# Patient Record
Sex: Female | Born: 2000 | Hispanic: Yes | Marital: Single | Smoking: Never smoker
Health system: Southern US, Community
[De-identification: ages and names within clinical notes are randomized; demographics above are authoritative.]

## PROBLEM LIST (undated history)

## (undated) DIAGNOSIS — K760 Fatty (change of) liver, not elsewhere classified: Secondary | ICD-10-CM

## (undated) DIAGNOSIS — J4520 Mild intermittent asthma, uncomplicated: Secondary | ICD-10-CM

## (undated) HISTORY — DX: Mild intermittent asthma, uncomplicated: J45.20

## (undated) HISTORY — DX: Fatty (change of) liver, not elsewhere classified: K76.0

---
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
## 2022-09-12 ENCOUNTER — Ambulatory Visit: Payer: Self-pay | Admitting: Emergency Medicine

## 2023-07-27 ENCOUNTER — Other Ambulatory Visit: Payer: Self-pay

## 2023-07-27 ENCOUNTER — Ambulatory Visit
Admission: RE | Admit: 2023-07-27 | Discharge: 2023-07-27 | Disposition: A | Discharge status: Home / Self Care | Payer: Self-pay | Source: Ambulatory Visit | Attending: Physician Assistant | Admitting: Physician Assistant

## 2023-07-27 ENCOUNTER — Ambulatory Visit: Payer: Self-pay

## 2023-07-27 VITALS — BP 115/77 | HR 109 | Temp 98.2°F | Resp 20 | Ht 66.0 in | Wt 245.0 lb

## 2023-07-27 DIAGNOSIS — R102 Pelvic and perineal pain: Secondary | ICD-10-CM | POA: Diagnosis not present

## 2023-07-27 DIAGNOSIS — R21 Rash and other nonspecific skin eruption: Secondary | ICD-10-CM | POA: Diagnosis not present

## 2023-07-27 DIAGNOSIS — R300 Dysuria: Secondary | ICD-10-CM | POA: Diagnosis not present

## 2023-07-27 LAB — POCT URINALYSIS DIP (MANUAL ENTRY)
Glucose, UA: NEGATIVE mg/dL
Nitrite, UA: NEGATIVE
Protein Ur, POC: >=300 mg/dL — AB
Spec Grav, UA: >=1.03 — AB (ref 1.010–1.025)
Urobilinogen, UA: 0.2 U/dL
pH, UA: 6 (ref 5.0–8.0)

## 2023-07-27 LAB — POCT URINE PREGNANCY: Preg Test, Ur: NEGATIVE

## 2023-07-27 MED ORDER — FLUCONAZOLE 150 MG PO TABS: 150.0000 mg | ORAL_TABLET | ORAL | 0 refills | Status: AC | PRN

## 2023-07-27 MED ORDER — NITROFURANTOIN MONOHYD MACRO 100 MG PO CAPS: 100.0000 mg | ORAL_CAPSULE | Freq: Two times a day (BID) | ORAL | 0 refills | Status: AC

## 2023-07-27 NOTE — ED Provider Notes (Signed)
 [facility]    CSN: [id number] Arrival date & time: 07/27/23  0903      History   Chief Complaint Chief Complaint  Patient presents with   Vaginal Discomfort     HPI Alison Lara is a 23 y.o. female.   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 No pertinent surgical history.  OB History   No obstetric history on file.      Home Medications    Prior to Admission medications   Medication Sig Start Date End Date Taking? Authorizing Provider  fluconazole (DIFLUCAN) 150 MG tablet Take 1 tablet (150 mg total) by mouth every three (3) days as needed. May repeat in 3 days if symptoms not resolved 07/27/23  Yes Rhyann Berton E, PA-C  nitrofurantoin, macrocrystal-monohydrate, (MACROBID) 100 MG capsule Take 1 capsule (100 mg total) by mouth 2 (two) times daily for 5 days. 07/27/23 08/01/23 Yes Arleatha Philipps E, PA-C  albuterol (PROVENTIL HFA;VENTOLIN HFA) 108 (90 Base) MCG/ACT inhaler Inhale 2 puffs into the lungs every 6 (six) hours as needed for wheezing or shortness of breath.    [provider]  Omega 3 1000 MG CAPS Take 1,000 mg by mouth daily.     [provider]  Pediatric  Multivit-Minerals-C (MULTIVITAMINS PEDIATRIC PO) Take 1 tablet by mouth daily.     [provider]    Family History Family History  Problem Relation Age of Onset   Diabetes Other    Hypertension Paternal Grandmother    Diabetes Paternal Grandfather    Heart disease Neg Hx     Social History Social History   Tobacco Use   Smoking status: Never   Smokeless tobacco: Never  Vaping Use   Vaping status: Every Day  Substance Use Topics   Alcohol use: No   Drug use: No     Allergies   Patient has no known allergies.   Review of Systems Review of Systems  Genitourinary:  Positive for dysuria and vaginal pain. Negative for hematuria, vaginal bleeding and vaginal discharge.  Skin:  Positive for rash.     Physical Exam Triage Vital Signs ED Triage Vitals  Encounter Vitals Group     BP 07/27/23 0950 115/77     Systolic BP Percentile --      Diastolic BP Percentile --      Pulse Rate 07/27/23 0950 (!) 109     Resp 07/27/23 0950 20     Temp 07/27/23 0950 98.2 F (36.8 C)     Temp Source 07/27/23 0950 Oral     SpO2 07/27/23 0950 97 %     Weight 07/27/23 0951 245 lb (111.1 kg)  Height 07/27/23 0951 5\' 6"  (1.676 m)     Head Circumference --      Peak Flow --      Pain Score 07/27/23 0951 3     Pain Loc --      Pain Education --      Exclude from Growth Chart --    No data found.  Updated Vital Signs BP 115/77 (BP Location: Right Arm)   Pulse (!) 109   Temp 98.2 F (36.8 C) (Oral)   Resp 20   Ht 5\' 6"  (1.676 m)   Wt 245 lb (111.1 kg)   LMP 07/13/2023 (Exact Date)   SpO2 97%   BMI 39.54 kg/m   Visual Acuity Right Eye Distance:   Left Eye Distance:   Bilateral Distance:    Right Eye Near:   Left Eye Near:    Bilateral Near:     Physical Exam Vitals reviewed. Exam conducted with a chaperone present.  Constitutional:      General: She is awake.     Appearance: Normal appearance. She is well-developed and well-groomed.  HENT:     Head:  Normocephalic and atraumatic.  Eyes:     General: Lids are normal. Gaze aligned appropriately.     Extraocular Movements: Extraocular movements intact.     Conjunctiva/sclera: Conjunctivae normal.  Pulmonary:     Effort: Pulmonary effort is normal.  Genitourinary:    Pubic Area: Rash present.     Tanner stage (genital): 5.     Labia:        Right: Rash and tenderness present.        Left: Rash and tenderness present.      Vagina: Vaginal discharge and tenderness present.     Comments: Patient has redness and irritation along labia minora and labia majora.  Vaginal opening also appears erythematous and there is scant amount of watery discharge Neurological:     General: No focal deficit present.     Mental Status: She is alert and oriented to person, place, and time.     GCS: GCS eye subscore is 4. GCS verbal subscore is 5. GCS motor subscore is 6.     Cranial Nerves: No cranial nerve deficit, dysarthria or facial asymmetry.  Psychiatric:        Attention and Perception: Attention and perception normal.        Mood and Affect: Mood and affect normal.        Speech: Speech normal.        Behavior: Behavior normal. Behavior is cooperative.      UC Treatments / Results  Labs (all labs ordered are listed, but only abnormal results are displayed) Labs Reviewed  POCT URINALYSIS DIP (MANUAL ENTRY) - Abnormal; Notable for the following components:      Result Value   Color, UA straw (*)    Clarity, UA turbid (*)    Bilirubin, UA small (*)    Ketones, POC UA small (15) (*)    Spec Grav, UA >=1.030 (*)    Blood, UA small (*)    Protein Ur, POC >=300 (*)    Leukocytes, UA Small (1+) (*)    All other components within normal limits  URINE CULTURE  POCT URINE PREGNANCY  CERVICOVAGINAL ANCILLARY ONLY    EKG   Radiology No results found.  Procedures Procedures (including critical care time)  Medications Ordered in UC Medications - No data to display  Initial Impression /  Assessment and Plan /  UC Course  I have reviewed the triage vital signs and the nursing notes.  Pertinent labs & imaging results that were available during my care of the patient were reviewed by me and considered in my medical decision making (see chart for details).      Final Clinical Impressions(s) / UC Diagnoses   Final diagnoses:  Dysuria  Vulvovaginal rash  Vulvovaginal discomfort   Patient has today with concerns for vulvovaginal discomfort and dysuria.  She reports this has been ongoing since Monday and has progressively worsened throughout the week.  Urine dip was notable for leukocytes, protein, blood, ketones, bilirubin.  At this time I am suspicious for potential UTI so we will send in Macrobid p.o. twice daily x 5 days and send sample for culture for ID and susceptibility testing.  Physical exam is also notable for erythema and irritation of the labia majora and labia minora as well as the vaginal introitus.  At this time suspect potential yeast infection so we will go ahead and send in Diflucan.  Cervicovaginal swab collected for definitive evaluation.  Results to dictate further management.  ED return precautions reviewed and provided in after visit summary.  Follow-up as needed.    Discharge Instructions      You were seen today for concerns for vulvovaginal discomfort and pain with urination.  Your urine sample demonstrated signs consistent with likely UTI.  I have sent in an antibiotic called Macrobid for you to take twice per day for 5 days to help treat this.  We have sent a sample for urine culture to identify the bacteria and make sure that the antibiotic will be sufficient to treat this.  We will keep you updated on those results once they are available. You were also seen for concerns of vaginal discomfort and redness.  At this time your symptoms appear consistent with a vulvovaginal infection such as a yeast infection.  I have sent in a medication called Diflucan.   Use as directed to assist with this.  We have also collected a cervicovaginal swab to assess for bacterial vaginosis, trichomonas, yeast, gonorrhea, chlamydia.  We will keep you updated with those results once they are available. In the meantime I recommend trying to find a gentle cleanser such as Cetaphil or a pH balanced vulvovaginal wash to help gently cleanse the area.  You can use warm water and 1 of these gentle soaps to help cleanse the area while you are waiting for the results and for the irritation to resolve. If at any point you start to have fever, chills, difficulty urinating, more severe rash or pain, redness please go to the emergency room as these could be signs of a medical emergency.   ED Prescriptions     Medication Sig Dispense Auth. Provider   nitrofurantoin, macrocrystal-monohydrate, (MACROBID) 100 MG capsule Take 1 capsule (100 mg total) by mouth 2 (two) times daily for 5 days. 10 capsule Naydene Kamrowski E, PA-C   fluconazole (DIFLUCAN) 150 MG tablet Take 1 tablet (150 mg total) by mouth every three (3) days as needed. May repeat in 3 days if symptoms not resolved 2 tablet Verbie Babic E, PA-C      PDMP not reviewed this encounter.   Suzzanne Brunkhorst, Pearla Bottom, PA-C 07/27/23 1210

## 2023-07-27 NOTE — Discharge Instructions (Addendum)
 You were seen today for concerns for vulvovaginal discomfort and pain with urination.  Your urine sample demonstrated signs consistent with likely UTI.  I have sent in an antibiotic called Macrobid for you to take twice per day for 5 days to help treat this.  We have sent a sample for urine culture to identify the bacteria and make sure that the antibiotic will be sufficient to treat this.  We will keep you updated on those results once they are available. You were also seen for concerns of vaginal discomfort and redness.  At this time your symptoms appear consistent with a vulvovaginal infection such as a yeast infection.  I have sent in a medication called Diflucan.  Use as directed to assist with this.  We have also collected a cervicovaginal swab to assess for bacterial vaginosis, trichomonas, yeast, gonorrhea, chlamydia.  We will keep you updated with those results once they are available. In the meantime I recommend trying to find a gentle cleanser such as Cetaphil or a pH balanced vulvovaginal wash to help gently cleanse the area.  You can use warm water and 1 of these gentle soaps to help cleanse the area while you are waiting for the results and for the irritation to resolve. If at any point you start to have fever, chills, difficulty urinating, more severe rash or pain, redness please go to the emergency room as these could be signs of a medical emergency.

## 2023-07-27 NOTE — ED Triage Notes (Signed)
 Pt presents with complaints of vaginal discomfort x 5-6 days. Pt states it burns to urinate. Pt currently rates her overall vaginal pain a 3/10 at rest, pain increases with urination and ambulation. Denies taking or applying medications PTA. Pt states the vaginal area is very red and irritated.

## 2023-07-28 ENCOUNTER — Encounter (HOSPITAL_BASED_OUTPATIENT_CLINIC_OR_DEPARTMENT_OTHER): Payer: Self-pay | Admitting: Emergency Medicine

## 2023-07-28 ENCOUNTER — Emergency Department (HOSPITAL_BASED_OUTPATIENT_CLINIC_OR_DEPARTMENT_OTHER)
Admission: EM | Admit: 2023-07-28 | Discharge: 2023-07-29 | Disposition: A | Discharge status: Home / Self Care | Payer: Self-pay | Attending: Emergency Medicine | Admitting: Emergency Medicine

## 2023-07-28 DIAGNOSIS — N939 Abnormal uterine and vaginal bleeding, unspecified: Secondary | ICD-10-CM | POA: Diagnosis not present

## 2023-07-28 DIAGNOSIS — N898 Other specified noninflammatory disorders of vagina: Secondary | ICD-10-CM | POA: Diagnosis present

## 2023-07-28 DIAGNOSIS — A6000 Herpesviral infection of urogenital system, unspecified: Secondary | ICD-10-CM | POA: Diagnosis not present

## 2023-07-28 DIAGNOSIS — A6004 Herpesviral vulvovaginitis: Secondary | ICD-10-CM | POA: Insufficient documentation

## 2023-07-28 DIAGNOSIS — B009 Herpesviral infection, unspecified: Secondary | ICD-10-CM | POA: Diagnosis not present

## 2023-07-28 LAB — URINALYSIS, ROUTINE W REFLEX MICROSCOPIC
Bacteria, UA: NONE SEEN
Bilirubin Urine: NEGATIVE
Glucose, UA: NEGATIVE mg/dL
Ketones, ur: 40 mg/dL — AB
Nitrite: NEGATIVE
Protein, ur: 100 mg/dL — AB
RBC / HPF: >50 RBC/hpf (ref 0–5)
Specific Gravity, Urine: 1.034 — ABNORMAL HIGH (ref 1.005–1.030)
WBC, UA: >50 WBC/hpf (ref 0–5)
pH: 6 (ref 5.0–8.0)

## 2023-07-28 LAB — CERVICOVAGINAL ANCILLARY ONLY
Bacterial Vaginitis (gardnerella): NEGATIVE
Candida Glabrata: NEGATIVE
Candida Vaginitis: NEGATIVE
Chlamydia: NEGATIVE
Comment: NEGATIVE
Comment: NEGATIVE
Comment: NEGATIVE
Comment: NEGATIVE
Comment: NEGATIVE
Comment: NORMAL
Neisseria Gonorrhea: NEGATIVE
Trichomonas: NEGATIVE

## 2023-07-28 LAB — WET PREP, GENITAL
Clue Cells Wet Prep HPF POC: NONE SEEN
Sperm: NONE SEEN
Trich, Wet Prep: NONE SEEN
WBC, Wet Prep HPF POC: >=10 — AB (ref <10)
Yeast Wet Prep HPF POC: NONE SEEN

## 2023-07-28 MED ORDER — FENTANYL CITRATE PF 50 MCG/ML IJ SOSY
50.0000 ug | PREFILLED_SYRINGE | Freq: Once | INTRAMUSCULAR | Status: AC
  Administered 2023-07-28: 50 ug via INTRAMUSCULAR

## 2023-07-28 MED ORDER — IBUPROFEN 800 MG PO TABS
800.0000 mg | ORAL_TABLET | Freq: Once | ORAL | Status: AC
  Administered 2023-07-28: 800 mg via ORAL
  Filled 2023-07-28: qty 1

## 2023-07-28 MED ORDER — FENTANYL CITRATE PF 50 MCG/ML IJ SOSY
50.0000 ug | PREFILLED_SYRINGE | Freq: Once | INTRAMUSCULAR | Status: DC
  Filled 2023-07-28: qty 1

## 2023-07-28 MED ORDER — VALACYCLOVIR HCL 500 MG PO TABS
1000.0000 mg | ORAL_TABLET | Freq: Once | ORAL | Status: AC
  Administered 2023-07-28: 1000 mg via ORAL
  Filled 2023-07-28: qty 2

## 2023-07-28 NOTE — ED Provider Notes (Signed)
 [facility] AT [facility] Provider Note   CSN: [id number] Arrival date & time: 07/28/23  2018     History {Add pertinent medical, surgical, social history, OB history to HPI:1} Chief Complaint  Patient presents with  . vaginal discomfort    Alison Lara is a 23 y.o. female.  HPI     Home Medications Prior to Admission medications   Medication Sig Start Date End Date Taking? Authorizing Provider  albuterol (PROVENTIL HFA;VENTOLIN HFA) 108 (90 Base) MCG/ACT inhaler Inhale 2 puffs into the lungs every 6 (six) hours as needed for wheezing or shortness of breath.    [provider]  fluconazole  (DIFLUCAN ) 150 MG tablet Take 1 tablet (150 mg total) by mouth every three (3) days as needed. May repeat in 3 days if symptoms not resolved 07/27/23   Mecum, Erin E, PA-C  nitrofurantoin , macrocrystal-monohydrate, (MACROBID ) 100 MG capsule Take 1 capsule (100 mg total) by mouth 2 (two) times daily for 5 days. 07/27/23 08/01/23  Mecum, Erin E, PA-C  Omega 3 1000 MG CAPS Take 1,000 mg by mouth daily.     [provider]  Pediatric Multivit-Minerals-C (MULTIVITAMINS PEDIATRIC PO) Take 1 tablet by mouth daily.     [provider]      Allergies    Patient has no known allergies.    Review of Systems   Review of Systems  Physical Exam Updated Vital Signs BP (!) 148/103 (BP Location: Right Arm)   Pulse (!) 114   Temp 98.4 F (36.9 C)   Resp 20   LMP 07/13/2023 (Exact Date)   SpO2 99%  Physical Exam  ED Results / Procedures / Treatments   Labs (all labs ordered are listed, but only abnormal results are displayed) Labs Reviewed  WET PREP, GENITAL - Abnormal; Notable for the following components:      Result Value   WBC, Wet Prep HPF POC >=10 (*)    All other components within normal limits  URINALYSIS, ROUTINE W REFLEX MICROSCOPIC - Abnormal; Notable for the following components:   APPearance HAZY (*)    Specific  Gravity, Urine 1.034 (*)    Hgb urine dipstick SMALL (*)    Ketones, ur 40 (*)    Protein, ur 100 (*)    Leukocytes,Ua LARGE (*)    All other components within normal limits    EKG None  Radiology No results found.  Procedures Procedures  {Document cardiac monitor, telemetry assessment procedure when appropriate:1}  Medications Ordered in ED Medications  ibuprofen (ADVIL) tablet 800 mg (800 mg Oral Given 07/28/23 2250)  fentaNYL (SUBLIMAZE) injection 50 mcg (50 mcg Intramuscular Given 07/28/23 2250)    ED Course/ Medical Decision Making/ A&P   {   Click here for ABCD2, HEART and other calculatorsREFRESH Note before signing :1}                              Medical Decision Making Amount and/or Complexity of Data Reviewed Labs: ordered.  Risk Prescription drug management.   ***  {Document critical care time when appropriate:1} {Document review of labs and clinical decision tools ie heart score, Chads2Vasc2 etc:1}  {Document your independent review of radiology images, and any outside records:1} {Document your discussion with family members, caretakers, and with consultants:1} {Document social determinants of health affecting pt's care:1} {Document your decision making why or why not admission, treatments were needed:1} Final Clinical Impression(s) / ED Diagnoses Final  diagnoses:  None    Rx / DC Orders ED Discharge Orders     None

## 2023-07-28 NOTE — ED Triage Notes (Signed)
 Vaginal discomfort.  Seen at [facility] for same on 07/27/2023 Given abt  Today more tender with discharge  Crying and tearful in triage

## 2023-07-29 MED ORDER — VALACYCLOVIR HCL 1 G PO TABS: 1000.0000 mg | ORAL_TABLET | Freq: Two times a day (BID) | ORAL | 0 refills | Status: AC

## 2023-07-29 MED ORDER — LIDOCAINE 5 % EX OINT: 1.0000 | TOPICAL_OINTMENT | CUTANEOUS | 0 refills | Status: AC | PRN

## 2023-07-29 NOTE — Discharge Instructions (Addendum)
 Your vaginal discomfort is due to a herpes infection.  You are being treated for this infection with a antiviral therapy called valacyclovir.  You were given your first dose here today.  Please continue taking this twice daily as prescribed for the next 10 days.  You can spread this virus to others by skin to skin contact when you have active lesions.  However, once these lesions heal, you are no longer contagious.  Please follow-up with your PCP if you continue to have symptoms after 10 days.  Your urine did not show any signs of a urinary tract infection.  You may discontinue the antibiotic prescribed by urgent care.  You did not have any yeast, BV, or trichomoniasis seen on your swab today.  You do not need to take the fluconazole  (Diflucan ) prescribed by urgent care as you do not have any yeast present.  Return to the ER for any worsening abdominal pain, fevers, any other new or concerning symptoms.

## 2023-07-30 LAB — URINE CULTURE: Culture: 20000 — AB

## 2023-07-31 ENCOUNTER — Telehealth (HOSPITAL_COMMUNITY): Payer: Self-pay

## 2023-07-31 MED ORDER — CEFDINIR 300 MG PO CAPS: 300.0000 mg | ORAL_CAPSULE | Freq: Two times a day (BID) | ORAL | 0 refills | Status: AC

## 2023-07-31 NOTE — Telephone Encounter (Signed)
 Per A. Hermanns, PA-C, " I would stop and switch to cefdinir 300mg  BID x 7 days. 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 This could be the cause and she may be having improvement already." Reviewed with patient, verified pharmacy, prescription sent.

## 2024-01-19 ENCOUNTER — Ambulatory Visit: Admitting: Family Medicine
